# Patient Record
Sex: Male | Born: 2000 | Race: White | Hispanic: No | Marital: Single | Smoking: Never smoker
Health system: Southern US, Community
[De-identification: ages and names within clinical notes are randomized; demographics above are authoritative.]

## PROBLEM LIST (undated history)

## (undated) DIAGNOSIS — J45909 Unspecified asthma, uncomplicated: Secondary | ICD-10-CM

## (undated) DIAGNOSIS — T7840XA Allergy, unspecified, initial encounter: Secondary | ICD-10-CM

## (undated) HISTORY — DX: Allergy, unspecified, initial encounter: T78.40XA

## (undated) HISTORY — PX: TYMPANOSTOMY TUBE PLACEMENT: SHX32

## (undated) HISTORY — PX: ADENOIDECTOMY: SUR15

## (undated) HISTORY — PX: TONSILLECTOMY: SUR1361

## (undated) HISTORY — DX: Unspecified asthma, uncomplicated: J45.909

---
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
## 2022-12-15 ENCOUNTER — Other Ambulatory Visit: Payer: Self-pay

## 2022-12-15 ENCOUNTER — Emergency Department
Admission: EM | Admit: 2022-12-15 | Discharge: 2022-12-15 | Disposition: A | Discharge status: Home / Self Care | Payer: BC Managed Care – PPO | Attending: Emergency Medicine | Admitting: Emergency Medicine

## 2022-12-15 DIAGNOSIS — H60391 Other infective otitis externa, right ear: Secondary | ICD-10-CM | POA: Insufficient documentation

## 2022-12-15 DIAGNOSIS — H9201 Otalgia, right ear: Secondary | ICD-10-CM | POA: Diagnosis present

## 2022-12-15 MED ORDER — NEOMYCIN-POLYMYXIN-HC 3.5-10000-1 OT SOLN: 4.0000 [drp] | Freq: Two times a day (BID) | OTIC | 0 refills | Status: AC

## 2022-12-15 MED ORDER — NEOMYCIN-POLYMYXIN-HC 3.5-10000-1 OT SUSP
4.0000 [drp] | Freq: Two times a day (BID) | OTIC | Status: DC
  Administered 2022-12-15: 4 [drp] via OTIC
  Filled 2022-12-15: qty 10

## 2022-12-15 NOTE — ED Provider Notes (Signed)
[facility] Provider Note  Patient Contact: 7:20 PM (approximate)   History   Otalgia   HPI  Shawn Juarez is a 22 y.o. male who presents to the emergency department complaining of right ear pain.  Patient has had pain over the last 2 to 3 days.  He reports that there is some clear drainage out of his right ear.  No fevers or chills.  Patient denies any URI symptoms of nasal congestion, sore throat or cough.  No recent water exposure.     Physical Exam   Triage Vital Signs: ED Triage Vitals  Enc Vitals Group     BP 12/15/22 1659 (!) 141/93     Pulse Rate 12/15/22 1659 88     Resp 12/15/22 1659 18     Temp 12/15/22 1659 98.9 F (37.2 C)     Temp src --      SpO2 12/15/22 1659 100 %     Weight --      Height --      Head Circumference --      Peak Flow --      Pain Score 12/15/22 1658 7     Pain Loc --      Pain Edu? --      Excl. in [place]? --     Most recent vital signs: Vitals:   12/15/22 1659  BP: (!) 141/93  Pulse: 88  Resp: 18  Temp: 98.9 F (37.2 C)  SpO2: 100%     General: Alert and in no acute distress. ENT:      Ears: EACs and TMs visualized bilaterally.  EAC is erythematous and edematous on the right.  TMs are slightly bulging bilaterally but no injection.  EAC on left is unremarkable.      Nose: No congestion/rhinnorhea.      Mouth/Throat: Mucous membranes are moist.  Cardiovascular:  Good peripheral perfusion Respiratory: Normal respiratory effort without tachypnea or retractions. Lungs CTAB. Musculoskeletal: Full range of motion to all extremities.  Neurologic:  No gross focal neurologic deficits are appreciated.  Skin:   No rash noted Other:   ED Results / Procedures / Treatments   Labs (all labs ordered are listed, but only abnormal results are displayed) Labs Reviewed - No data to display   EKG     RADIOLOGY    No results found.  PROCEDURES:  Critical Care performed: No  Procedures   MEDICATIONS  ORDERED IN ED: Medications  neomycin-polymyxin-hydrocortisone (CORTISPORIN) OTIC (EAR) suspension 4 drop (4 drops Right EAR Given 12/15/22 1945)     IMPRESSION / MDM / ASSESSMENT AND PLAN / ED COURSE  I reviewed the triage vital signs and the nursing notes.                                 Differential diagnosis includes, but is not limited to, otitis media, otitis externa, mastoiditis, eustachian tube dysfunction, TM perforation   Patient's presentation is most consistent with acute presentation with potential threat to life or bodily function.   Patient's diagnosis is consistent with otitis externa.  Patient presents emergency department complaining of right ear pain.  Patient has no reported trauma to the ear.  No fevers or chills.  No URI symptoms.  Patient has findings consistent with otitis externa on physical exam.  Antibiotic eardrops will be prescribed for the patient.  First dose given here in the emergency department.  Follow-up  primary care as needed.. P Patient is given ED precautions to return to the ED for any worsening or new symptoms.     FINAL CLINICAL IMPRESSION(S) / ED DIAGNOSES   Final diagnoses:  Other infective acute otitis externa of right ear     Rx / DC Orders   ED Discharge Orders          Ordered    neomycin-polymyxin-hydrocortisone (CORTISPORIN) OTIC solution  2 times daily        12/15/22 1919             Note:  This document was prepared using Dragon voice recognition software and may include unintentional dictation errors.   Brynda Peon 12/15/22 Carolann Littler, MD 12/16/22 [phone]

## 2022-12-15 NOTE — ED Triage Notes (Signed)
Pt comes with c/o right ear pain and fever since today.

## 2022-12-16 ENCOUNTER — Telehealth: Payer: Self-pay

## 2022-12-16 NOTE — Transitions of Care (Post Inpatient/ED Visit) (Signed)
   12/16/2022  Name: Shawn Juarez MRN: [id number] DOB: Jun 26, 2001  Today's TOC FU Call Status: Today's TOC FU Call Status:: Unsuccessul Call (1st Attempt) Unsuccessful Call (1st Attempt) Date: 12/16/22  Attempted to reach the patient regarding the most recent Inpatient/ED visit.  Follow Up Plan: Additional outreach attempts will be made to reach the patient to complete the Transitions of Care (Post Inpatient/ED visit) call.   Signature Francella Solian, CMA

## 2022-12-17 NOTE — Transitions of Care (Post Inpatient/ED Visit) (Addendum)
Appointment scheduled with Romilda Garret NP to establish care from Dr. Einar Pheasant.    12/17/2022  Name: Shawn Juarez MRN: [id number] DOB: 06-18-2001  Today's TOC FU Call Status: Today's TOC FU Call Status:: Successful TOC FU Call Competed Unsuccessful Call (1st Attempt) Date: 12/16/22 [facility] FU Call Complete Date: 12/17/22  Transition Care Management Follow-up Telephone Call Date of Discharge: 12/15/22 Discharge Facility: [facility]) Type of Discharge: Emergency Department Reason for ED Visit:  (ear pain) How have you been since you were released from the hospital?: Better Any questions or concerns?: No  Items Reviewed: Did you receive and understand the discharge instructions provided?: Yes Medications obtained and verified?: Yes (Medications Reviewed) Any new allergies since your discharge?: No Dietary orders reviewed?: NA Do you have support at home?: Yes People in Home: parent(s) Name of Support/Comfort Primary Source: Mom and dad  Home Care and Equipment/Supplies: [place] Ordered?: NA Any new equipment or medical supplies ordered?: NA  Functional Questionnaire: Do you need assistance with bathing/showering or dressing?: No Do you need assistance with meal preparation?: No Do you need assistance with eating?: No Do you have difficulty maintaining continence: No Do you need assistance with getting out of bed/getting out of a chair/moving?: No Do you have difficulty managing or taking your medications?: No  Follow up appointments reviewed: PCP Follow-up appointment confirmed?: Yes Date of PCP follow-up appointment?: 01/14/23 Follow-up Provider: [facility] Follow-up appointment confirmed?: NA Do you need transportation to your follow-up appointment?: No Do you understand care options if your condition(s) worsen?: Yes-patient verbalized understanding    [place], [place]

## 2023-01-14 ENCOUNTER — Encounter: Payer: Self-pay | Admitting: Family Medicine

## 2023-01-14 ENCOUNTER — Ambulatory Visit: Payer: BC Managed Care – PPO | Admitting: Nurse Practitioner
